# Patient Record
Sex: Male | Born: 1985 | Race: Black or African American | Hispanic: No | Marital: Single | Smoking: Former smoker
Health system: Southern US, Community
[De-identification: ages and names within clinical notes are randomized; demographics above are authoritative.]

## PROBLEM LIST (undated history)

## (undated) HISTORY — PX: VASECTOMY: SHX75

---
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
## 2016-02-04 ENCOUNTER — Encounter (HOSPITAL_COMMUNITY): Payer: Self-pay | Admitting: *Deleted

## 2016-02-04 ENCOUNTER — Emergency Department (HOSPITAL_COMMUNITY): Payer: BLUE CROSS/BLUE SHIELD

## 2016-02-04 DIAGNOSIS — J069 Acute upper respiratory infection, unspecified: Secondary | ICD-10-CM | POA: Insufficient documentation

## 2016-02-04 DIAGNOSIS — Z87891 Personal history of nicotine dependence: Secondary | ICD-10-CM | POA: Diagnosis not present

## 2016-02-04 DIAGNOSIS — R05 Cough: Secondary | ICD-10-CM | POA: Diagnosis present

## 2016-02-04 DIAGNOSIS — J40 Bronchitis, not specified as acute or chronic: Secondary | ICD-10-CM | POA: Insufficient documentation

## 2016-02-04 NOTE — ED Triage Notes (Signed)
Pt c/o productive cough of yellow sputum with chills x 1 week

## 2016-02-05 ENCOUNTER — Emergency Department (HOSPITAL_COMMUNITY)
Admission: EM | Admit: 2016-02-05 | Discharge: 2016-02-05 | Disposition: A | Discharge status: Home / Self Care | Payer: BLUE CROSS/BLUE SHIELD | Attending: Emergency Medicine | Admitting: Emergency Medicine

## 2016-02-05 DIAGNOSIS — J069 Acute upper respiratory infection, unspecified: Secondary | ICD-10-CM

## 2016-02-05 DIAGNOSIS — J40 Bronchitis, not specified as acute or chronic: Secondary | ICD-10-CM

## 2016-02-05 MED ORDER — HYDROCODONE-ACETAMINOPHEN 5-325 MG PO TABS
2.0000 | ORAL_TABLET | Freq: Once | ORAL | Status: AC
  Administered 2016-02-05: 2 via ORAL
  Filled 2016-02-05: qty 2

## 2016-02-05 MED ORDER — DEXAMETHASONE 4 MG PO TABS: 4.0000 mg | ORAL_TABLET | Freq: Two times a day (BID) | ORAL | 0 refills | Status: AC

## 2016-02-05 MED ORDER — PREDNISONE 20 MG PO TABS
40.0000 mg | ORAL_TABLET | Freq: Once | ORAL | Status: AC
  Administered 2016-02-05: 40 mg via ORAL
  Filled 2016-02-05: qty 2

## 2016-02-05 MED ORDER — HYDROCODONE-HOMATROPINE 5-1.5 MG/5ML PO SYRP: 5.0000 mL | ORAL_SOLUTION | Freq: Four times a day (QID) | ORAL | 0 refills | Status: AC | PRN

## 2016-02-05 MED ORDER — ONDANSETRON HCL 4 MG PO TABS
4.0000 mg | ORAL_TABLET | Freq: Once | ORAL | Status: AC
  Administered 2016-02-05: 4 mg via ORAL
  Filled 2016-02-05: qty 1

## 2016-02-05 MED ORDER — IBUPROFEN 800 MG PO TABS
800.0000 mg | ORAL_TABLET | Freq: Once | ORAL | Status: AC
  Administered 2016-02-05: 800 mg via ORAL
  Filled 2016-02-05: qty 1

## 2016-02-05 NOTE — ED Notes (Signed)
ED Provider at bedside. 

## 2016-02-05 NOTE — Discharge Instructions (Signed)
Your oxygen level is 99% on room air. Your vital signs within normal limits. Your chest x-ray is negative for acute findings. Please use Decadron 2 times daily with food. Use hycodan every 6 hours for cough. This medication may cause drowsiness.Please increase fluids.

## 2016-02-05 NOTE — ED Provider Notes (Signed)
[facility] DEPT Provider Note   CSN: [id number] Arrival date & time: 02/04/16  2024     History   Chief Complaint Chief Complaint  Patient presents with  . Cough    HPI Berta MinorDannie L Smalling is a 30 y.o. male.  The history is provided by the patient.  Cough  The current episode started more than 2 days ago. The problem occurs hourly. The problem has been gradually worsening. The cough is productive of sputum. The maximum temperature recorded prior to his arrival was 101 to 101.9 F. Associated symptoms include chest pain, chills and wheezing. He has tried nothing for the symptoms. He is not a smoker. His past medical history does not include bronchitis, pneumonia or asthma.    History reviewed. No pertinent past medical history.  There are no active problems to display for this patient.   Past Surgical History:  Procedure Laterality Date  . VASECTOMY         Home Medications    Prior to Admission medications   Not on File    Family History History reviewed. No pertinent family history.  Social History Social History  Substance Use Topics  . Smoking status: Former Games developermoker  . Smokeless tobacco: Never Used  . Alcohol use No     Allergies   Patient has no known allergies.   Review of Systems Review of Systems  Constitutional: Positive for chills.  Respiratory: Positive for cough and wheezing.   Cardiovascular: Positive for chest pain.  All other systems reviewed and are negative.    Physical Exam Updated Vital Signs BP 148/92   Pulse 95   Temp 98.7 F (37.1 C)   Resp 21   Ht 5\' 6"  (1.676 m)   Wt 90.7 kg   SpO2 100%   BMI 32.28 kg/m   Physical Exam  Constitutional: He is oriented to person, place, and time. He appears well-developed and well-nourished.  Non-toxic appearance.  HENT:  Head: Normocephalic.  Right Ear: Tympanic membrane and external ear normal.  Left Ear: Tympanic membrane and external ear normal.  Nasal congestion present. The  airway is patent on.  Eyes: EOM and lids are normal. Pupils are equal, round, and reactive to light.  Neck: Normal range of motion. Neck supple. Carotid bruit is not present. No tracheal deviation present.  Cardiovascular: Normal rate, regular rhythm, normal heart sounds, intact distal pulses and normal pulses.   Pulmonary/Chest: No stridor. No respiratory distress.  Course breath sounds present with occasional rhonchi. Symmetrical rise and fall of the chest. Patient speaks in complete sentences without problem.  Abdominal: Soft. Bowel sounds are normal. There is no tenderness. There is no guarding.  Musculoskeletal: Normal range of motion.  Lymphadenopathy:       Head (right side): No submandibular adenopathy present.       Head (left side): No submandibular adenopathy present.    He has no cervical adenopathy.  Neurological: He is alert and oriented to person, place, and time. He has normal strength. No cranial nerve deficit or sensory deficit.  Skin: Skin is warm and dry.  Psychiatric: He has a normal mood and affect. His speech is normal.  Nursing note and vitals reviewed.    ED Treatments / Results  Labs (all labs ordered are listed, but only abnormal results are displayed) Labs Reviewed - No data to display  EKG  EKG Interpretation None       Radiology Dg Chest 2 View  Result Date: 02/04/2016 CLINICAL DATA:  Productive  cough and intermittent fever for 1 week. EXAM: CHEST  2 VIEW COMPARISON:  None. FINDINGS: Minimal linear opacities in the right base, accentuated due to a shallow inspiration. No airspace consolidation. No effusions. Normal pulmonary vasculature. Normal hilar, mediastinal and cardiac contours. IMPRESSION: No active cardiopulmonary disease. Electronically Signed   By: Ellery Plunkaniel R Mitchell M.D.   On: 02/04/2016 21:06    Procedures Procedures (including critical care time)  Medications Ordered in ED Medications - No data to display   Initial Impression /  Assessment and Plan / ED Course  I have reviewed the triage vital signs and the nursing notes.  Pertinent labs & imaging results that were available during my care of the patient were reviewed by me and considered in my medical decision making (see chart for details).  Clinical Course     *I have reviewed nursing notes, vital signs, and all appropriate lab and imaging results for this patient.**  Final Clinical Impressions(s) / ED Diagnoses  Vital signs within normal limits. Chest x-ray is negative for acute problem. Pulse oximetry is 99% on room air. Prescription for Decadron and Hycodan given to the patient. We discussed the importance of increasing fluids. Use Tylenol or ibuprofen for soreness. The patient will see his primary physician or return to the emergency department if not improving.    Final diagnoses:  None    New Prescriptions New Prescriptions   No medications on file     Ivery QualeHobson Derril Franek, PA-C 02/05/16 0127    Devoria AlbeIva Knapp, MD 02/05/16 [phone]

## 2017-07-08 IMAGING — DX DG CHEST 2V
2 series · 2 of 2 positions shown · non-contrast
Comparison: None.

CLINICAL DATA: Productive cough and intermittent fever for 1 week.

EXAM:
CHEST  2 VIEW

[chest pa]
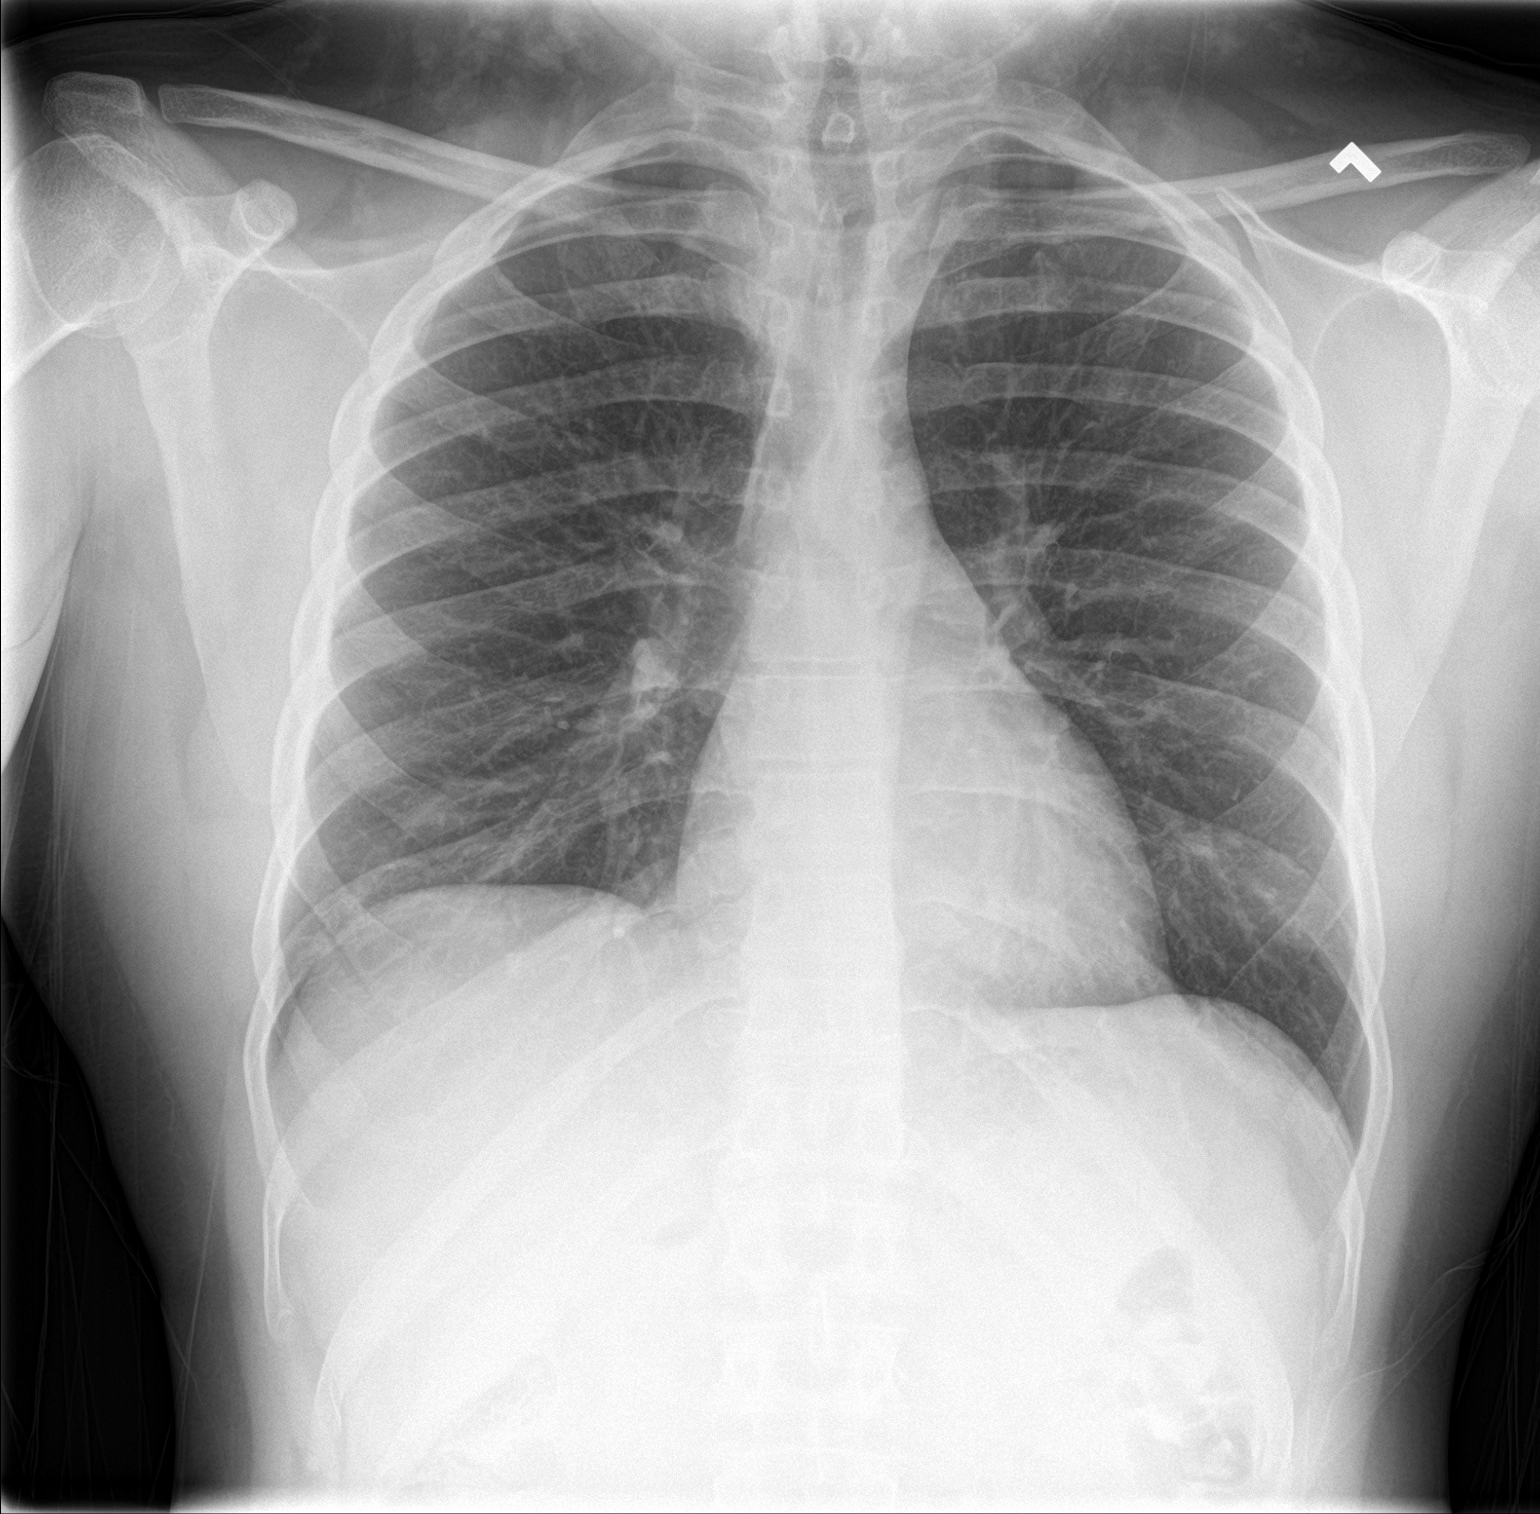

[chest lat]
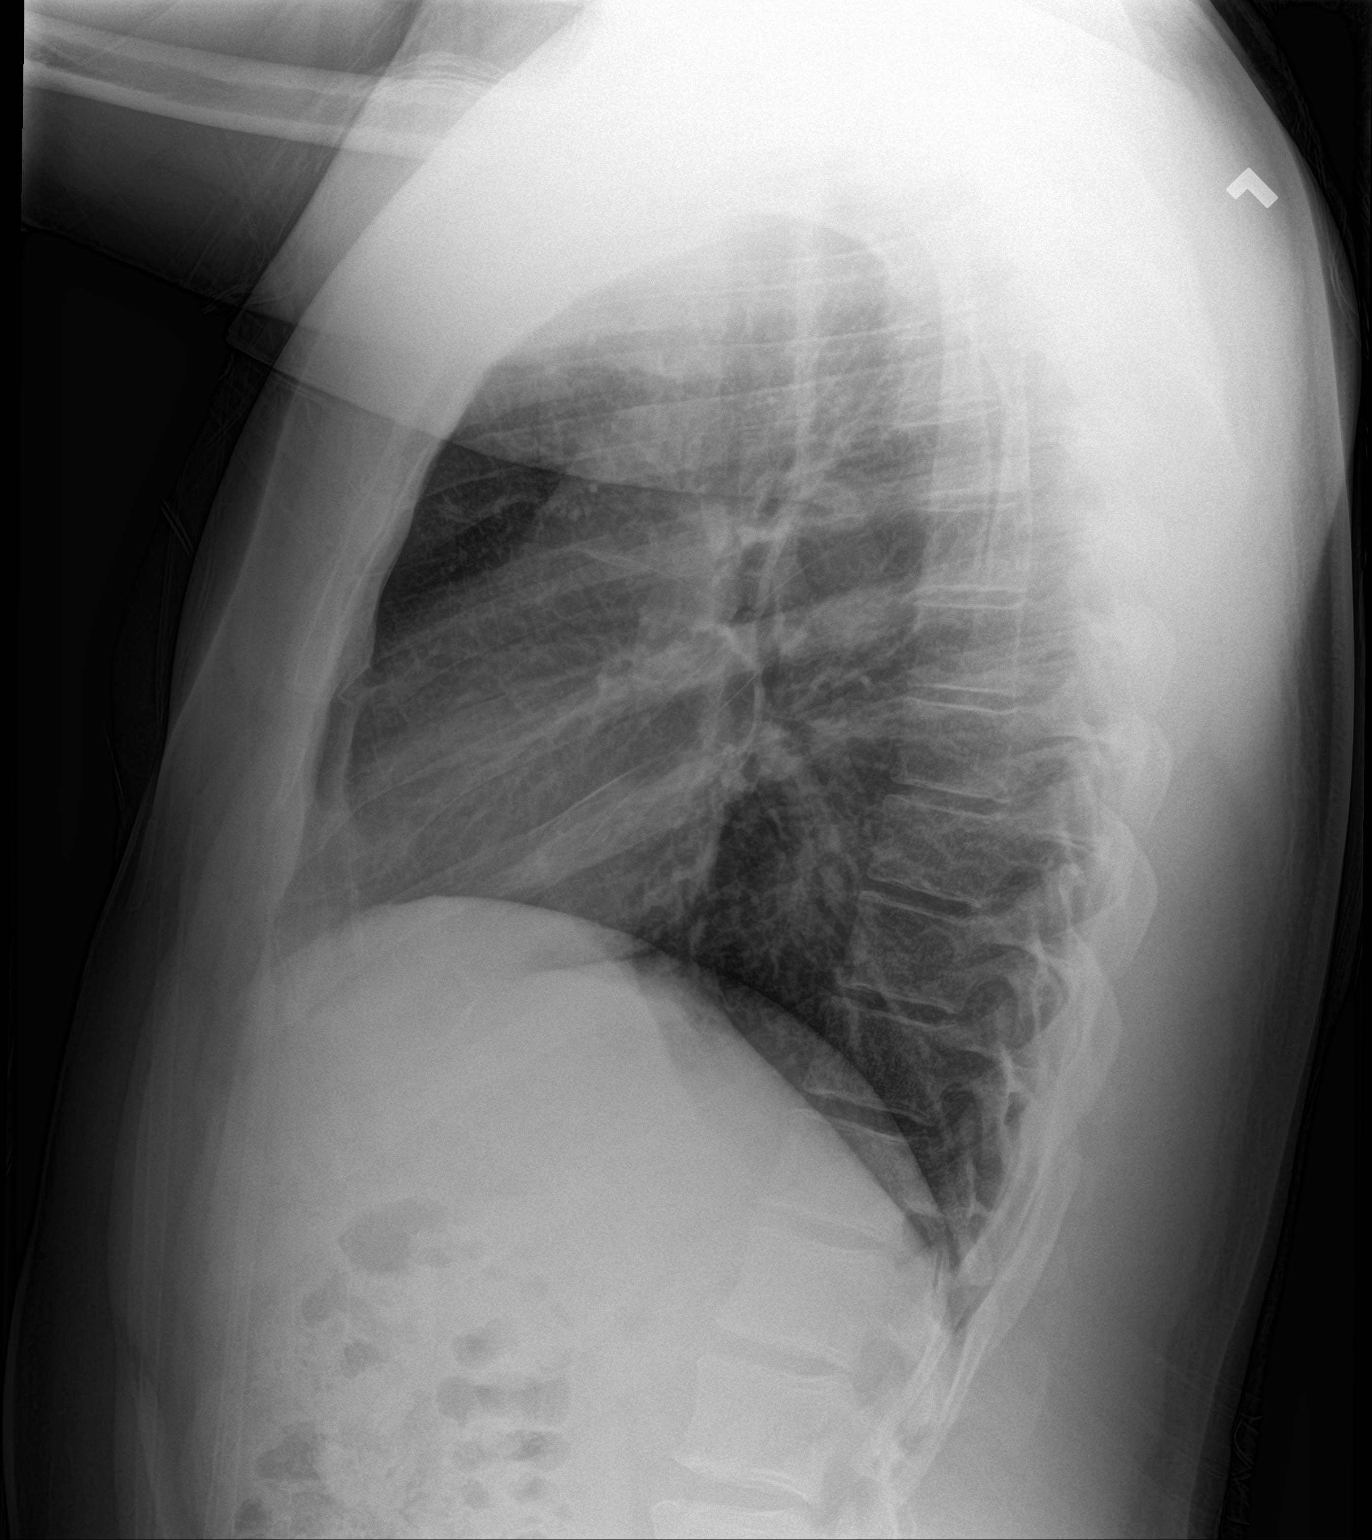

[2 of 2 positions shown; findings below may reference images not displayed]

FINDINGS: Minimal linear opacities in the right base, accentuated due to a
shallow inspiration. No airspace consolidation. No effusions. Normal
pulmonary vasculature. Normal hilar, mediastinal and cardiac
contours.
IMPRESSION: No active cardiopulmonary disease.

## 2020-07-29 ENCOUNTER — Other Ambulatory Visit (HOSPITAL_COMMUNITY): Payer: Self-pay
